# Patient Record
Sex: Male | Born: 2000 | Hispanic: No | Marital: Single | Smoking: Never smoker
Health system: Southern US, Community
[De-identification: ages and names within clinical notes are randomized; demographics above are authoritative.]

---

## 2021-02-02 ENCOUNTER — Emergency Department (HOSPITAL_COMMUNITY)
Admission: EM | Admit: 2021-02-02 | Discharge: 2021-02-03 | Discharge status: Against Medical Advice | Payer: 59 | Attending: Emergency Medicine | Admitting: Emergency Medicine

## 2021-02-02 ENCOUNTER — Encounter (HOSPITAL_COMMUNITY): Payer: Self-pay | Admitting: Emergency Medicine

## 2021-02-02 ENCOUNTER — Emergency Department (HOSPITAL_COMMUNITY): Payer: 59

## 2021-02-02 DIAGNOSIS — M542 Cervicalgia: Secondary | ICD-10-CM | POA: Diagnosis not present

## 2021-02-02 DIAGNOSIS — M545 Low back pain, unspecified: Secondary | ICD-10-CM | POA: Diagnosis not present

## 2021-02-02 DIAGNOSIS — Y92410 Unspecified street and highway as the place of occurrence of the external cause: Secondary | ICD-10-CM | POA: Insufficient documentation
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
## 2021-02-03 NOTE — ED Notes (Signed)
Pt called multiple time for a room with no respond.

## 2021-02-04 ENCOUNTER — Emergency Department (HOSPITAL_COMMUNITY): Payer: 59

## 2021-02-04 ENCOUNTER — Emergency Department (HOSPITAL_COMMUNITY)
Admission: EM | Admit: 2021-02-04 | Discharge: 2021-02-05 | Discharge status: Against Medical Advice | Payer: 59 | Attending: Student | Admitting: Student

## 2021-02-04 ENCOUNTER — Other Ambulatory Visit: Payer: Self-pay

## 2021-02-04 ENCOUNTER — Other Ambulatory Visit (HOSPITAL_COMMUNITY): Payer: 59

## 2021-02-04 DIAGNOSIS — Z5321 Procedure and treatment not carried out due to patient leaving prior to being seen by health care provider: Secondary | ICD-10-CM | POA: Insufficient documentation

## 2021-02-04 DIAGNOSIS — M549 Dorsalgia, unspecified: Secondary | ICD-10-CM | POA: Diagnosis not present
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
## 2021-02-05 NOTE — ED Notes (Signed)
Patient called for vitals recheck with no response 
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
## 2023-06-26 ENCOUNTER — Encounter (HOSPITAL_BASED_OUTPATIENT_CLINIC_OR_DEPARTMENT_OTHER): Payer: Self-pay

## 2023-06-26 ENCOUNTER — Other Ambulatory Visit: Payer: Self-pay

## 2023-06-26 ENCOUNTER — Emergency Department (HOSPITAL_BASED_OUTPATIENT_CLINIC_OR_DEPARTMENT_OTHER)
Admission: EM | Admit: 2023-06-26 | Discharge: 2023-06-26 | Disposition: A | Discharge status: Home / Self Care | Payer: BLUE CROSS/BLUE SHIELD | Attending: Emergency Medicine | Admitting: Emergency Medicine

## 2023-06-26 ENCOUNTER — Emergency Department (HOSPITAL_BASED_OUTPATIENT_CLINIC_OR_DEPARTMENT_OTHER): Payer: BLUE CROSS/BLUE SHIELD

## 2023-06-26 DIAGNOSIS — R6883 Chills (without fever): Secondary | ICD-10-CM | POA: Diagnosis not present

## 2023-06-26 DIAGNOSIS — R630 Anorexia: Secondary | ICD-10-CM | POA: Insufficient documentation

## 2023-06-26 DIAGNOSIS — R110 Nausea: Secondary | ICD-10-CM | POA: Insufficient documentation

## 2023-06-26 DIAGNOSIS — R1084 Generalized abdominal pain: Secondary | ICD-10-CM | POA: Insufficient documentation

## 2023-06-26 DIAGNOSIS — R5383 Other fatigue: Secondary | ICD-10-CM | POA: Insufficient documentation

## 2023-06-26 LAB — URINALYSIS, W/ REFLEX TO CULTURE (INFECTION SUSPECTED)
Bilirubin Urine: NEGATIVE
Glucose, UA: NEGATIVE mg/dL
Hgb urine dipstick: NEGATIVE
Ketones, ur: NEGATIVE mg/dL
Leukocytes,Ua: NEGATIVE
Nitrite: NEGATIVE
Protein, ur: NEGATIVE mg/dL
Specific Gravity, Urine: 1.01 (ref 1.005–1.030)
pH: 7 (ref 5.0–8.0)

## 2023-06-26 LAB — CBC WITH DIFFERENTIAL/PLATELET
Abs Immature Granulocytes: 0.02 10*3/uL (ref 0.00–0.07)
Basophils Absolute: 0.1 10*3/uL (ref 0.0–0.1)
Basophils Relative: 1 %
Eosinophils Absolute: 0.2 10*3/uL (ref 0.0–0.5)
Eosinophils Relative: 3 %
HCT: 46.8 % (ref 39.0–52.0)
Hemoglobin: 16.4 g/dL (ref 13.0–17.0)
Immature Granulocytes: 0 %
Lymphocytes Relative: 21 %
Lymphs Abs: 1.4 10*3/uL (ref 0.7–4.0)
MCH: 27.5 pg (ref 26.0–34.0)
MCHC: 35 g/dL (ref 30.0–36.0)
MCV: 78.5 fL — ABNORMAL LOW (ref 80.0–100.0)
Monocytes Absolute: 0.8 10*3/uL (ref 0.1–1.0)
Monocytes Relative: 11 %
Neutro Abs: 4.3 10*3/uL (ref 1.7–7.7)
Neutrophils Relative %: 64 %
Platelets: 249 10*3/uL (ref 150–400)
RBC: 5.96 MIL/uL — ABNORMAL HIGH (ref 4.22–5.81)
RDW: 11.8 % (ref 11.5–15.5)
WBC: 6.7 10*3/uL (ref 4.0–10.5)
nRBC: 0 % (ref 0.0–0.2)

## 2023-06-26 LAB — COMPREHENSIVE METABOLIC PANEL
ALT: 44 U/L (ref 0–44)
AST: 29 U/L (ref 15–41)
Albumin: 4.7 g/dL (ref 3.5–5.0)
Alkaline Phosphatase: 49 U/L (ref 38–126)
Anion gap: 11 (ref 5–15)
BUN: 8 mg/dL (ref 6–20)
CO2: 26 mmol/L (ref 22–32)
Calcium: 9.5 mg/dL (ref 8.9–10.3)
Chloride: 100 mmol/L (ref 98–111)
Creatinine, Ser: 0.81 mg/dL (ref 0.61–1.24)
GFR, Estimated: >60 mL/min (ref >60)
Glucose, Bld: 114 mg/dL — ABNORMAL HIGH (ref 70–99)
Potassium: 3.8 mmol/L (ref 3.5–5.1)
Sodium: 137 mmol/L (ref 135–145)
Total Bilirubin: 0.9 mg/dL (ref 0.3–1.2)
Total Protein: 8.1 g/dL (ref 6.5–8.1)

## 2023-06-26 LAB — LACTIC ACID, PLASMA
Lactic Acid, Venous: 0.9 mmol/L (ref 0.5–1.9)
Lactic Acid, Venous: 0.8 mmol/L (ref 0.5–1.9)

## 2023-06-26 LAB — LIPASE, BLOOD: Lipase: 31 U/L (ref 11–51)

## 2023-06-26 MED ORDER — OMEPRAZOLE 20 MG PO CPDR: 20.0000 mg | DELAYED_RELEASE_CAPSULE | Freq: Every day | ORAL | 0 refills | Status: AC

## 2023-06-26 MED ORDER — IOHEXOL 300 MG/ML  SOLN
75.0000 mL | Freq: Once | INTRAMUSCULAR | Status: AC | PRN
  Administered 2023-06-26: 75 mL via INTRAVENOUS

## 2023-06-26 MED ORDER — MORPHINE SULFATE (PF) 4 MG/ML IV SOLN
4.0000 mg | Freq: Once | INTRAVENOUS | Status: AC
  Administered 2023-06-26: 4 mg via INTRAVENOUS
  Filled 2023-06-26: qty 1

## 2023-06-26 MED ORDER — ALUM & MAG HYDROXIDE-SIMETH 200-200-20 MG/5ML PO SUSP
30.0000 mL | Freq: Once | ORAL | Status: AC
  Administered 2023-06-26: 30 mL via ORAL
  Filled 2023-06-26: qty 30

## 2023-06-26 MED ORDER — SUCRALFATE 1 G PO TABS: 1.0000 g | ORAL_TABLET | Freq: Three times a day (TID) | ORAL | 0 refills | Status: AC

## 2023-06-26 MED ORDER — ONDANSETRON HCL 4 MG/2ML IJ SOLN
4.0000 mg | Freq: Once | INTRAMUSCULAR | Status: AC
  Administered 2023-06-26: 4 mg via INTRAVENOUS
  Filled 2023-06-26: qty 2

## 2023-06-26 MED ORDER — LIDOCAINE VISCOUS HCL 2 % MT SOLN
15.0000 mL | Freq: Once | OROMUCOSAL | Status: AC
  Administered 2023-06-26: 15 mL via ORAL
  Filled 2023-06-26: qty 15

## 2023-06-26 MED ORDER — ONDANSETRON 4 MG PO TBDP: 4.0000 mg | ORAL_TABLET | Freq: Three times a day (TID) | ORAL | 0 refills | Status: AC | PRN

## 2023-06-26 NOTE — Discharge Instructions (Signed)
Your history, exam, workup today shows evidence of some inflammation called mesenteric adenitis but no clear evidence of acute bacterial infection or surgical problem today.  Your labs are otherwise reassuring as we discussed.  We feel you are safe for discharge home to take the medicine still for stomach acid and help treat the nausea.  Please rest and stay hydrated and avoid spicy and greasy foods.  If symptoms persist, please follow-up with a primary doctor or even a GI doctor.  If symptoms worsen rapidly, return to the nearest emergency department.

## 2023-06-26 NOTE — ED Provider Notes (Signed)
[place] [facility]ENC[facility] AT [facility] [place] Provider Note   CSN: [id number] Arrival date & time: 06/26/23  4401     History  No chief complaint on file.   Roberto Cain is a 22 y.o. male.  The history is provided by the patient and medical records. No language interpreter was used.  Abdominal Pain Pain location:  Generalized Pain quality: aching, cramping and dull   Pain radiates to:  Does not radiate Pain severity:  Severe Onset quality:  Gradual Duration:  2 days Timing:  Constant Progression:  Waxing and waning Chronicity:  New Context: not trauma   Relieved by:  Nothing Worsened by:  Movement Ineffective treatments:  None tried Associated symptoms: anorexia, chills, constipation, fatigue and nausea   Associated symptoms: no chest pain, no cough, no diarrhea, no dysuria, no fever, no shortness of breath and no vomiting (dry heaving)   Risk factors: has not had multiple surgeries        Home Medications Prior to Admission medications   Not on File      Allergies    Patient has no known allergies.    Review of Systems   Review of Systems  Constitutional:  Positive for appetite change, chills and fatigue. Negative for fever.  HENT:  Negative for congestion.   Respiratory:  Negative for cough, chest tightness, shortness of breath and wheezing.   Cardiovascular:  Negative for chest pain.  Gastrointestinal:  Positive for abdominal pain, anorexia, constipation and nausea. Negative for diarrhea and vomiting (dry heaving).  Genitourinary:  Negative for dysuria and flank pain.  Musculoskeletal:  Negative for back pain and neck pain.  Skin:  Negative for rash and wound.  Neurological:  Negative for headaches.  Psychiatric/Behavioral:  Negative for agitation and confusion.   All other systems reviewed and are negative.   Physical Exam Updated Vital Signs There were no vitals taken for this visit. Physical Exam Vitals and nursing note reviewed.   Constitutional:      General: He is not in acute distress.    Appearance: He is well-developed. He is not ill-appearing, toxic-appearing or diaphoretic.  HENT:     Head: Normocephalic and atraumatic.     Nose: No congestion or rhinorrhea.     Mouth/Throat:     Mouth: Mucous membranes are moist.     Pharynx: No oropharyngeal exudate or posterior oropharyngeal erythema.  Eyes:     Extraocular Movements: Extraocular movements intact.     Conjunctiva/sclera: Conjunctivae normal.     Pupils: Pupils are equal, round, and reactive to light.  Cardiovascular:     Rate and Rhythm: Normal rate and regular rhythm.     Heart sounds: No murmur heard. Pulmonary:     Effort: Pulmonary effort is normal. No respiratory distress.     Breath sounds: Normal breath sounds. No wheezing, rhonchi or rales.  Chest:     Chest wall: No tenderness.  Abdominal:     Palpations: Abdomen is soft.     Tenderness: There is abdominal tenderness. There is no right CVA tenderness, left CVA tenderness, guarding or rebound.  Musculoskeletal:        General: No swelling.     Cervical back: Neck supple.  Skin:    General: Skin is warm and dry.     Capillary Refill: Capillary refill takes less than 2 seconds.     Findings: No erythema or rash.  Neurological:     Mental Status: He is alert.  Psychiatric:  Mood and Affect: Mood normal.     ED Results / Procedures / Treatments   Labs (all labs ordered are listed, but only abnormal results are displayed) Labs Reviewed  CBC WITH DIFFERENTIAL/PLATELET - Abnormal; Notable for the following components:      Result Value   RBC 5.96 (*)    MCV 78.5 (*)    All other components within normal limits  COMPREHENSIVE METABOLIC PANEL - Abnormal; Notable for the following components:   Glucose, Bld 114 (*)    All other components within normal limits  URINALYSIS, W/ REFLEX TO CULTURE (INFECTION SUSPECTED) - Abnormal; Notable for the following components:   Bacteria,  UA RARE (*)    All other components within normal limits  LACTIC ACID, PLASMA  LACTIC ACID, PLASMA  LIPASE, BLOOD    EKG None  Radiology CT ABDOMEN PELVIS W CONTRAST  Result Date: 06/26/2023 CLINICAL DATA:  Abdominal pain, acute. RLQ tenderness. Nausea. Dry heaving. EXAM: CT ABDOMEN AND PELVIS WITH CONTRAST TECHNIQUE: Multidetector CT imaging of the abdomen and pelvis was performed using the standard protocol following bolus administration of intravenous contrast. RADIATION DOSE REDUCTION: This exam was performed according to the departmental dose-optimization program which includes automated exposure control, adjustment of the mA and/or kV according to patient size and/or use of iterative reconstruction technique. CONTRAST:  75mL OMNIPAQUE IOHEXOL 300 MG/ML  SOLN COMPARISON:  None Available. FINDINGS: Lower chest: No acute abnormality. Hepatobiliary: No focal liver abnormality is seen. No gallstones, gallbladder wall thickening, or biliary dilatation. Pancreas: Unremarkable. No pancreatic ductal dilatation or surrounding inflammatory changes. Spleen: Normal in size without focal abnormality. Adrenals/Urinary Tract: Adrenal glands are unremarkable. Kidneys are normal, without renal calculi, focal lesion, or hydronephrosis. Bladder is unremarkable. Stomach/Bowel: Stomach is within normal limits. Appendix appears normal. No evidence of bowel wall thickening, distention, or inflammatory changes. Vascular/Lymphatic: No significant vascular findings are present. There are a few prominent right lower quadrant mesenteric lymph nodes, measuring up to 6 mm in short axis (series 301, image 52). Reproductive: Unremarkable Other: No abdominal wall hernia or abnormality. No abdominopelvic ascites. Musculoskeletal: No acute or significant osseous findings. IMPRESSION: 1. There are a few prominent right lower quadrant mesenteric lymph nodes, which are nonspecific, although can be seen in the setting of mesenteric  adenitis. 2. Otherwise, no acute localizing findings in the abdomen or pelvis. The appendix is within normal limits. Electronically Signed   By: Hart Robinsons M.D.   On: 06/26/2023 10:03    Procedures Procedures    Medications Ordered in ED Medications  ondansetron (ZOFRAN) injection 4 mg (4 mg Intravenous Given 06/26/23 0752)  morphine (PF) 4 MG/ML injection 4 mg (4 mg Intravenous Given 06/26/23 0752)  alum & mag hydroxide-simeth (MAALOX/MYLANTA) 200-200-20 MG/5ML suspension 30 mL (30 mLs Oral Given 06/26/23 0750)    And  lidocaine (XYLOCAINE) 2 % viscous mouth solution 15 mL (15 mLs Oral Given 06/26/23 0750)  iohexol (OMNIPAQUE) 300 MG/ML solution 75 mL (75 mLs Intravenous Contrast Given 06/26/23 0851)    ED Course/ Medical Decision Making/ A&P                                 Medical Decision Making Amount and/or Complexity of Data Reviewed Labs: ordered. Radiology: ordered.  Risk OTC drugs. Prescription drug management.    Domingo Fuson is a 22 y.o. male with no significant past medical history who presents with abdominal pain.  According to patient, for  the last 2 days or so patient has been having discomfort across his abdomen.  He reports this feels different than heartburn type discomfort he is had in the past.  He reports that he has had some chills and some nausea and some dry heaving.  Has not had significant vomiting.  He had some constipation but is still having bowel movements and passing gas.  Denies any back pain.  Denies trauma.  Denies any rashes or skin changes.  Reports the pain has been getting more severe throughout the day and denies any groin symptoms.  Denies any dysuria or hematuria.  Denies any blood in his stool.  Denies any sick contacts.  Denies any URI symptoms such as congestion or cough.  Reports he has no appetite.  On exam, lungs were clear and chest was nontender.  Abdomen was diffusely tender but he was extremely tender in the right lower quadrant.   Flanks and back nontender.  Rest of exam unremarkable with intact movement in all extremities and good pulses.  Symmetric smile.  Pupils symmetric and reactive.  Patient otherwise had no evidence of acute trauma.  Given his decreased appetite, chills, nausea, and tenderness in the right lower quadrant I do feel need to get CT imaging to rule out etiology such as appendicitis.  Also considerations are diverticulitis, cholecystitis, or gastritis.  Patient does have a history in the chart for some heartburn and we discussed that it could be related to evolution of his heartburn.  We will give a GI cocktail as he did report that some ginger ale and a smoothie helped briefly yesterday.  Otherwise we will keep him n.p.o. and give some pain and nausea medicine.  Will get labs, urine, and CT imaging.  Anticipate reassessment after workup to determine disposition.  CT scan shows possible mesenteric adenitis but otherwise no surgical problem seen.  No evidence of diverticulitis, cholecystitis, or appendicitis.  No bowel obstruction or other acute abnormality seen.  Labs otherwise reassuring.  Patient reassessed and is feeling better.  Suspect some gastric irritation and bowel relation.  We agreed to give her prescription for nausea medicine and prescription for Carafate and Prilosec and instructions follow-up with GI if symptoms persist or worsen.  Patient agrees with plan of care and will follow-up with outpatient team.  Patient noted questions or concerns and was discharged in good condition.         Final Clinical Impression(s) / ED Diagnoses Final diagnoses:  Generalized abdominal pain  Nausea    Rx / DC Orders ED Discharge Orders          Ordered    omeprazole (PRILOSEC) 20 MG capsule  Daily        06/26/23 1026    sucralfate (CARAFATE) 1 g tablet  3 times daily with meals & bedtime        06/26/23 1026    ondansetron (ZOFRAN-ODT) 4 MG disintegrating tablet  Every 8 hours PRN         06/26/23 1026            Clinical Impression: 1. Generalized abdominal pain   2. Nausea     Disposition: Discharge  Condition: Good  I have discussed the results, Dx and Tx plan with the pt(& family if present). He/she/they expressed understanding and agree(s) with the plan. Discharge instructions discussed at great length. Strict return precautions discussed and pt &/or family have verbalized understanding of the instructions. No further questions at time of discharge.  New Prescriptions   OMEPRAZOLE (PRILOSEC) 20 MG CAPSULE    Take 1 capsule (20 mg total) by mouth daily.   ONDANSETRON (ZOFRAN-ODT) 4 MG DISINTEGRATING TABLET    Take 1 tablet (4 mg total) by mouth every 8 (eight) hours as needed for nausea or vomiting.   SUCRALFATE (CARAFATE) 1 G TABLET    Take 1 tablet (1 g total) by mouth 4 (four) times daily -  with meals and at bedtime.    Follow Up: Gastroenterology, Deboraha Sprang [address] [place] [address] [place] [address] [phone]     [facility] [address] [place] [address] [place] [address] [phone] Schedule an appointment as soon as possible for a visit        Toia Micale, Canary Brim, MD 06/26/23 1031

## 2023-06-26 NOTE — ED Triage Notes (Signed)
In for eval of sharp abd pain onset yesterday with nausea and dry heaving. Denies dysuria or vomiting.ginger ale and a smoothie made pain better.
